# Patient Record
Sex: Male | Born: 1995 | Race: White | Hispanic: No | Marital: Single | Smoking: Never smoker
Health system: Southern US, Community
[De-identification: ages and names within clinical notes are randomized; demographics above are authoritative.]

---

## 2017-08-12 ENCOUNTER — Ambulatory Visit
Admission: EM | Admit: 2017-08-12 | Discharge: 2017-08-12 | Disposition: A | Discharge status: Home / Self Care | Payer: BLUE CROSS/BLUE SHIELD | Attending: Family Medicine | Admitting: Family Medicine

## 2017-08-12 ENCOUNTER — Other Ambulatory Visit: Payer: Self-pay

## 2017-08-12 DIAGNOSIS — Z23 Encounter for immunization: Secondary | ICD-10-CM | POA: Diagnosis not present

## 2017-08-12 DIAGNOSIS — S61411A Laceration without foreign body of right hand, initial encounter: Secondary | ICD-10-CM

## 2017-08-12 DIAGNOSIS — W268XXA Contact with other sharp object(s), not elsewhere classified, initial encounter: Secondary | ICD-10-CM

## 2017-08-12 MED ORDER — TETANUS-DIPHTH-ACELL PERTUSSIS 5-2.5-18.5 LF-MCG/0.5 IM SUSP
0.5000 mL | Freq: Once | INTRAMUSCULAR | Status: AC
  Administered 2017-08-12: 0.5 mL via INTRAMUSCULAR

## 2017-08-12 NOTE — ED Provider Notes (Signed)
[facility]    CSN: [id number] Arrival date & time: 08/12/17  1719     History   Chief Complaint Chief Complaint  Patient presents with  . Laceration    HPI Sergio Rivera is a 22 y.o. male.   HPI  22 year old male presents with laceration to his right dominant palm that he cut on a rusty fuel tank  about 10 AM yesterday at work.  He was urged by his coworkers to get a tetanus shot.  Denies any fever chills drainage numbness tingling or weakness.  He had applied over-the-counter glue to cover over the laceration.       History reviewed. No pertinent past medical history.  There are no active problems to display for this patient.   History reviewed. No pertinent surgical history.     Home Medications    Prior to Admission medications   Not on File    Family History Family History  Problem Relation Age of Onset  . Hypertension Mother   . Liver disease Father   . Alcoholism Father     Social History Social History   Tobacco Use  . Smoking status: Never Smoker  . Smokeless tobacco: Never Used  Substance Use Topics  . Alcohol use: No    Frequency: Never  . Drug use: No     Allergies   Patient has no known allergies.   Review of Systems Review of Systems  Constitutional: Negative for activity change, chills, fatigue and fever.  Skin: Positive for wound.  All other systems reviewed and are negative.    Physical Exam Triage Vital Signs ED Triage Vitals  Enc Vitals Group     BP 08/12/17 1737 (!) 114/58     Pulse Rate 08/12/17 1737 80     Resp 08/12/17 1737 16     Temp 08/12/17 1737 98 F (36.7 C)     Temp Source 08/12/17 1737 Oral     SpO2 08/12/17 1737 98 %     Weight 08/12/17 1734 143 lb (64.9 kg)     Height 08/12/17 1734 6\' 1"  (1.854 m)     Head Circumference --      Peak Flow --      Pain Score 08/12/17 1737 2     Pain Loc --      Pain Edu? --      Excl. in GC? --    No data found.  Updated Vital Signs BP (!)  114/58 (BP Location: Right Arm)   Pulse 80   Temp 98 F (36.7 C) (Oral)   Resp 16   Ht 6\' 1"  (1.854 m)   Wt 143 lb (64.9 kg)   SpO2 98%   BMI 18.87 kg/m   Visual Acuity Right Eye Distance:   Left Eye Distance:   Bilateral Distance:    Right Eye Near:   Left Eye Near:    Bilateral Near:     Physical Exam  Constitutional: He is oriented to person, place, and time. He appears well-developed and well-nourished. No distress.  HENT:  Head: Normocephalic.  Eyes: Pupils are equal, round, and reactive to light.  Neck: Normal range of motion.  Musculoskeletal: Normal range of motion.  Neurological: He is alert and oriented to person, place, and time.  Skin: Skin is warm and dry. He is not diaphoretic.  Examination of the right dominant palm shows a 1.5 cm longitudinally oriented laceration over the thenar eminence base ulnarly.  FDS/ FDP of all of his  fingers are intact.  He has full range of motion.  There is no erythema or drainage ecchymosis  around the laceration.  There is no induration.  Psychiatric: He has a normal mood and affect. His behavior is normal. Judgment and thought content normal.  Nursing note and vitals reviewed.    UC Treatments / Results  Labs (all labs ordered are listed, but only abnormal results are displayed) Labs Reviewed - No data to display  EKG  EKG Interpretation None       Radiology No results found.  Procedures Procedures (including critical care time)  Medications Ordered in UC Medications  Tdap (BOOSTRIX) injection 0.5 mL (0.5 mLs Intramuscular Given 08/12/17 1743)     Initial Impression / Assessment and Plan / UC Course  I have reviewed the triage vital signs and the nursing notes.  Pertinent labs & imaging results that were available during my care of the patient were reviewed by me and considered in my medical decision making (see chart for details).     Plan: 1. Test/x-ray results and diagnosis reviewed with patient 2. rx  as per orders; risks, benefits, potential side effects reviewed with patient 3. Recommend supportive treatment with ring for signs of infection.  These were outlined in detail to the patient.  Otherwise he should expect full recovery. 4. F/u prn if symptoms worsen or don't improve   Final Clinical Impressions(s) / UC Diagnoses   Final diagnoses:  Laceration of palm, right, initial encounter    ED Discharge Orders    None       Controlled Substance Prescriptions [place] Controlled Substance Registry consulted? Not Applicable   [name], PA-C 08/12/17 2004

## 2017-08-12 NOTE — ED Notes (Signed)
Wound cleansed with Normal saline and Hibiclens

## 2017-08-12 NOTE — Discharge Instructions (Signed)
Watch out for signs or symptoms of infection that we discussed ;increased drainage or pain swelling or redness.  If any these occur return to our clinic immediately.

## 2017-08-12 NOTE — ED Triage Notes (Signed)
Pt reports cutting the palm of his hand on a rusty oil tank yesterday morning at 10:00 a.m.

## 2017-08-12 NOTE — Medical Student Note (Signed)
[facility] Provider Student Note For educational purposes for Medical, PA and NP students only and not part of the legal medical record.   CSN: [id number] Arrival date & time: 08/12/17  1719     History   Chief Complaint Chief Complaint  Patient presents with  . Laceration    HPI Xzaviar Maloof is a 22 y.o. male.  HPI  22 year old white male presents complaining of laceration to his right palm from a rusty fuel tank yesterday at 10 am while at work. States he came today to get a tetanus shot after his coworkers advised him to. Denies fever/chills, drainage, numbness/tingling, weakness. He is right hand dominant.    History reviewed. No pertinent past medical history.  There are no active problems to display for this patient.   History reviewed. No pertinent surgical history.     Home Medications    Prior to Admission medications   Not on File    Family History Family History  Problem Relation Age of Onset  . Hypertension Mother   . Liver disease Father   . Alcoholism Father     Social History Social History   Tobacco Use  . Smoking status: Never Smoker  . Smokeless tobacco: Never Used  Substance Use Topics  . Alcohol use: No    Frequency: Never  . Drug use: No     Allergies   Patient has no known allergies.   Review of Systems Review of Systems  Constitutional: Negative for chills and fever.  HENT: Negative for ear pain and sore throat.   Eyes: Negative for pain and visual disturbance.  Respiratory: Negative for cough and shortness of breath.   Cardiovascular: Negative for chest pain and palpitations.  Gastrointestinal: Negative for abdominal pain and vomiting.  Genitourinary: Negative for dysuria and hematuria.  Musculoskeletal: Negative for arthralgias and back pain.  Skin: Positive for wound. Negative for color change and rash.  Neurological: Negative for seizures and syncope.  All other systems reviewed and are  negative.    Physical Exam Updated Vital Signs BP (!) 114/58 (BP Location: Right Arm)   Pulse 80   Temp 98 F (36.7 C) (Oral)   Resp 16   Ht 6\' 1"  (1.854 m)   Wt 64.9 kg   SpO2 98%   BMI 18.87 kg/m   Physical Exam  Constitutional: He is oriented to person, place, and time. He appears well-developed and well-nourished.  HENT:  Head: Normocephalic and atraumatic.  Eyes: Conjunctivae are normal.  Neck: Neck supple.  Cardiovascular: Normal rate and regular rhythm.  No murmur heard. Pulmonary/Chest: Effort normal and breath sounds normal. No respiratory distress.  Abdominal: Soft. There is no tenderness.  Musculoskeletal: Normal range of motion. He exhibits no edema.  Full ROM intact and 5/5 RUE strength.   Neurological: He is alert and oriented to person, place, and time.  Bilateral UE are neurovascularly intact. No diminished sensation.   Skin: Skin is warm and dry. Capillary refill takes less than 2 seconds.  2 cm laceration to palmar aspect of right hand without drainage, erythema, tenderness, induration. It is covered with wound glue that patient applied yesterday. Bleeding is well controlled.   Psychiatric: He has a normal mood and affect.  Nursing note and vitals reviewed.    ED Treatments / Results  Labs (all labs ordered are listed, but only abnormal results are displayed) Labs Reviewed - No data to display  EKG  EKG Interpretation None  Radiology No results found.  Procedures Procedures (including critical care time)  Medications Ordered in ED Medications  Tdap (BOOSTRIX) injection 0.5 mL (0.5 mLs Intramuscular Given 08/12/17 1743)     Initial Impression / Assessment and Plan / ED Course  I have reviewed the triage vital signs and the nursing notes.  Pertinent labs & imaging results that were available during my care of the patient were reviewed by me and considered in my medical decision making (see chart for details).     22 year old  white male presents for right palm laceration that happened 10 am yesterday. No current signs of infection and wound is well healing with bleeding well controlled. There is no indication for laceration repair. Patient counseled to monitor for signs of infection such as increased pain, erythema, drainage, fever/chills. He received tetanus shot in clinic today. Patient was agreeable to plan.   Final Clinical Impressions(s) / ED Diagnoses   Final diagnoses:  None    New Prescriptions New Prescriptions   No medications on file

## 2017-08-15 ENCOUNTER — Telehealth: Payer: Self-pay | Admitting: *Deleted

## 2017-08-15 NOTE — Telephone Encounter (Signed)
Called patient, patient reported laceration healing well. Advised patient to follow up with PCP or [facility] if he observes swelling, redness, of drainage.

## 2017-11-26 ENCOUNTER — Ambulatory Visit (INDEPENDENT_AMBULATORY_CARE_PROVIDER_SITE_OTHER): Payer: BLUE CROSS/BLUE SHIELD

## 2017-11-26 ENCOUNTER — Ambulatory Visit
Admission: EM | Admit: 2017-11-26 | Discharge: 2017-11-26 | Disposition: A | Discharge status: Home / Self Care | Payer: BLUE CROSS/BLUE SHIELD | Attending: Family Medicine | Admitting: Family Medicine

## 2017-11-26 ENCOUNTER — Ambulatory Visit: Payer: BLUE CROSS/BLUE SHIELD

## 2017-11-26 DIAGNOSIS — R0789 Other chest pain: Secondary | ICD-10-CM

## 2017-11-26 DIAGNOSIS — M940 Chondrocostal junction syndrome [Tietze]: Secondary | ICD-10-CM
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
## 2021-02-01 ENCOUNTER — Other Ambulatory Visit: Payer: Self-pay

## 2021-02-01 DIAGNOSIS — Y990 Civilian activity done for income or pay: Secondary | ICD-10-CM | POA: Insufficient documentation

## 2021-02-01 DIAGNOSIS — M545 Low back pain, unspecified: Secondary | ICD-10-CM | POA: Insufficient documentation

## 2021-02-01 DIAGNOSIS — X500XXA Overexertion from strenuous movement or load, initial encounter: Secondary | ICD-10-CM | POA: Insufficient documentation

## 2021-02-01 DIAGNOSIS — G8929 Other chronic pain: Secondary | ICD-10-CM | POA: Insufficient documentation

## 2021-02-01 NOTE — ED Triage Notes (Signed)
Lower back pain for months. States taking too much excedrin.

## 2021-02-02 ENCOUNTER — Encounter (HOSPITAL_BASED_OUTPATIENT_CLINIC_OR_DEPARTMENT_OTHER): Payer: Self-pay | Admitting: Emergency Medicine

## 2021-02-02 ENCOUNTER — Emergency Department (HOSPITAL_BASED_OUTPATIENT_CLINIC_OR_DEPARTMENT_OTHER)
Admission: EM | Admit: 2021-02-02 | Discharge: 2021-02-02 | Disposition: A | Discharge status: Home / Self Care | Payer: BLUE CROSS/BLUE SHIELD | Attending: Emergency Medicine | Admitting: Emergency Medicine

## 2021-02-02 DIAGNOSIS — G8929 Other chronic pain: Secondary | ICD-10-CM

## 2021-02-02 MED ORDER — MELOXICAM 15 MG PO TABS
ORAL_TABLET | ORAL | 0 refills | Status: AC
  Filled 2021-02-06: qty 30, 30d supply, fill #0

## 2021-02-02 MED ORDER — NAPROXEN 250 MG PO TABS
500.0000 mg | ORAL_TABLET | Freq: Once | ORAL | Status: AC
  Administered 2021-02-02: 500 mg via ORAL
  Filled 2021-02-02: qty 2

## 2021-02-02 NOTE — ED Provider Notes (Signed)
[facility] Provider Note: Lowella Dell, MD, FACEP  CSN: [id number] MRN: [id number] ARRIVAL: 02/01/21 at 2347 ROOM: [facility]04/[facility]   CHIEF COMPLAINT  Back Pain   HISTORY OF PRESENT ILLNESS  02/02/21 3:43 AM Sergio Rivera is a 25 y.o. male who has had low back pain for several months.  The back pain is located in the bilateral paralumbar region.  He started a new job about 2 weeks ago that involves a lot of heavy lifting.  At work yesterday evening he stepped down and felt a sudden exacerbation of the pain in his back.  He rated it an 8 out of 10 on arrival here.  He had taken about 5 Excedrin tablets as well as ibuprofen and he is now feeling significantly better.  The pain does not radiate to his legs.  It is worse with movement of the lower back but not palpation.  He has no associated numbness or weakness.   History reviewed. No pertinent past medical history.  History reviewed. No pertinent surgical history.  Family History  Problem Relation Age of Onset   Hypertension Mother    Liver disease Father    Alcoholism Father     Social History   Tobacco Use   Smoking status: Never   Smokeless tobacco: Never  Vaping Use   Vaping Use: Every day  Substance Use Topics   Alcohol use: No   Drug use: No    Prior to Admission medications   Not on File    Allergies Patient has no known allergies.   REVIEW OF SYSTEMS  Negative except as noted here or in the History of Present Illness.   PHYSICAL EXAMINATION  Initial Vital Signs Blood pressure 115/63, pulse 81, temperature 98.7 F (37.1 C), temperature source Oral, resp. rate 16, height 6\' 2"  (1.88 m), weight 68 kg, SpO2 100 %.  Examination General: Well-developed, well-nourished male in no acute distress; appearance consistent with age of record HENT: normocephalic; atraumatic Eyes: Normal appearance Neck: supple Heart: regular rate and rhythm Lungs: clear to auscultation bilaterally Back: Nontender; pain  on movement of lower back Abdomen: soft; nondistended; nontender; bowel sounds present Extremities: No deformity; full range of motion; pulses normal Neurologic: Awake, alert and oriented; motor function intact in all extremities and symmetric; no facial droop Skin: Warm and dry Psychiatric: Normal mood and affect   RESULTS  Summary of this visit's results, reviewed and interpreted by myself:   EKG Interpretation  Date/Time:    Ventricular Rate:    PR Interval:    QRS Duration:   QT Interval:    QTC Calculation:   R Axis:     Text Interpretation:          Laboratory Studies: No results found for this or any previous visit (from the past 24 hour(s)). Imaging Studies: No results found.  ED COURSE and MDM  Nursing notes, initial and subsequent vitals signs, including pulse oximetry, reviewed and interpreted by myself.  Vitals:   02/02/21 0000 02/02/21 0002  BP:  115/63  Pulse:  81  Resp:  16  Temp:  98.7 F (37.1 C)  TempSrc:  Oral  SpO2:  100%  Weight: 68 kg   Height: 6\' 2"  (1.88 m)    Medications  naproxen (NAPROSYN) tablet 500 mg (has no administration in time range)    The patient's back pain has been fairly chronic but is clearly being exacerbated by a new job involving a lot of lifting.  I do not  believe plain x-rays would be of benefit at this time although the patient may benefit from an MRI at a later time.  He was advised that Excedrin and other NSAIDs can be hard on the stomach and we will try a course of Mobic which is less irritating.  We will refer to sports medicine for evaluation and further treatment.  PROCEDURES  Procedures   ED DIAGNOSES     ICD-10-CM   1. Exacerbation of chronic back pain  M54.9    G89.29          Adewale Pucillo, Jonny Ruiz, MD 02/02/21 [phone]

## 2021-02-06 ENCOUNTER — Other Ambulatory Visit (HOSPITAL_BASED_OUTPATIENT_CLINIC_OR_DEPARTMENT_OTHER): Payer: Self-pay

## 2021-02-06 ENCOUNTER — Other Ambulatory Visit: Payer: Self-pay

## 2021-02-06 ENCOUNTER — Ambulatory Visit (INDEPENDENT_AMBULATORY_CARE_PROVIDER_SITE_OTHER): Payer: Self-pay | Admitting: Family Medicine

## 2021-02-06 ENCOUNTER — Encounter: Payer: Self-pay | Admitting: Family Medicine

## 2021-02-06 ENCOUNTER — Ambulatory Visit (HOSPITAL_BASED_OUTPATIENT_CLINIC_OR_DEPARTMENT_OTHER)
Admission: RE | Admit: 2021-02-06 | Discharge: 2021-02-06 | Disposition: A | Discharge status: Home / Self Care | Payer: Self-pay | Source: Ambulatory Visit | Attending: Family Medicine | Admitting: Family Medicine

## 2021-02-06 VITALS — BP 110/62 | Ht 74.0 in | Wt 150.0 lb

## 2021-02-06 DIAGNOSIS — S39012A Strain of muscle, fascia and tendon of lower back, initial encounter: Secondary | ICD-10-CM | POA: Insufficient documentation

## 2021-02-06 MED ORDER — KETOROLAC TROMETHAMINE 30 MG/ML IJ SOLN
30.0000 mg | Freq: Once | INTRAMUSCULAR | Status: AC
  Administered 2021-02-06: 30 mg via INTRAMUSCULAR

## 2021-02-06 MED ORDER — METHYLPREDNISOLONE ACETATE 40 MG/ML IJ SUSP
40.0000 mg | Freq: Once | INTRAMUSCULAR | Status: AC
  Administered 2021-02-06: 40 mg via INTRAMUSCULAR

## 2021-02-06 NOTE — Assessment & Plan Note (Signed)
Significant spasm appreciated on exam today.  Has been ongoing for period of time.  Less likely for pars defect. -Counseled on home exercise therapy and supportive care. -IM Depo-Medrol and IM Toradol. -X-ray. -Referral to physical therapy. -Provided work note.

## 2021-02-06 NOTE — Progress Notes (Signed)
  Sergio Rivera - 25 y.o. male MRN [id number]  Date of birth: 01/10/1996  SUBJECTIVE:  Including CC & ROS.  No chief complaint on file.   Sergio Rivera is a 25 y.o. male that is presenting with acute back pain.  Pain was exacerbated by the that he does.  He does a lot of heavy lifting repetitively.  Pain has been ongoing for about a year.  Denies any injuries.   Review of Systems See HPI   HISTORY: Past Medical, Surgical, Social, and Family History Reviewed & Updated per EMR.   Pertinent Historical Findings include:  History reviewed. No pertinent past medical history.  History reviewed. No pertinent surgical history.  Family History  Problem Relation Age of Onset   Hypertension Mother    Liver disease Father    Alcoholism Father     Social History   Socioeconomic History   Marital status: Single    Spouse name: Not on file   Number of children: Not on file   Years of education: Not on file   Highest education level: Not on file  Occupational History   Not on file  Tobacco Use   Smoking status: Never   Smokeless tobacco: Never  Vaping Use   Vaping Use: Every day  Substance and Sexual Activity   Alcohol use: No   Drug use: No   Sexual activity: Not on file  Other Topics Concern   Not on file  Social History Narrative   Not on file   Social Determinants of Health   Financial Resource Strain: Not on file  Food Insecurity: Not on file  Transportation Needs: Not on file  Physical Activity: Not on file  Stress: Not on file  Social Connections: Not on file  Intimate Partner Violence: Not on file     PHYSICAL EXAM:  VS: BP 110/62 (BP Location: Left Arm, Patient Position: Sitting, Cuff Size: Normal)   Ht 6\' 2"  (1.88 m)   Wt 150 lb (68 kg)   BMI 19.26 kg/m  Physical Exam Gen: NAD, alert, cooperative with exam, well-appearing MSK:  Limited flexion and extension. Unable stand on 1 foot. Neurovascular intact     ASSESSMENT & PLAN:   Strain of lumbar  region Significant spasm appreciated on exam today.  Has been ongoing for period of time.  Less likely for pars defect. -Counseled on home exercise therapy and supportive care. -IM Depo-Medrol and IM Toradol. -X-ray. -Referral to physical therapy. -Provided work note.

## 2021-02-06 NOTE — Patient Instructions (Signed)
Nice to meet you Please try heat  Please try the exercises  Please try physical therapy  I will call with the results from today   Please send me a message in MyChart with any questions or updates.  Please see me back in 2 weeks.   --Dr. Jordan Likes

## 2021-02-09 ENCOUNTER — Telehealth: Payer: Self-pay | Admitting: Family Medicine

## 2021-02-09 NOTE — Telephone Encounter (Signed)
Informed of results. Will provide.   Myra Rude, MD [facility] 02/09/2021, 1:44 PM

## 2021-02-13 ENCOUNTER — Ambulatory Visit: Payer: Self-pay | Attending: Family Medicine | Admitting: Physical Therapy

## 2021-02-20 ENCOUNTER — Ambulatory Visit: Payer: Self-pay | Admitting: Physical Therapy

## 2021-02-20 ENCOUNTER — Ambulatory Visit: Payer: Self-pay | Admitting: Family Medicine

## 2021-02-20 NOTE — Progress Notes (Deleted)
  Sergio Rivera - 25 y.o. male MRN [id number]  Date of birth: 02/26/1996  SUBJECTIVE:  Including CC & ROS.  No chief complaint on file.   Sergio Rivera is a 25 y.o. male that is  ***.  ***   Review of Systems See HPI   HISTORY: Past Medical, Surgical, Social, and Family History Reviewed & Updated per EMR.   Pertinent Historical Findings include:  No past medical history on file.  No past surgical history on file.  Family History  Problem Relation Age of Onset   Hypertension Mother    Liver disease Father    Alcoholism Father     Social History   Socioeconomic History   Marital status: Single    Spouse name: Not on file   Number of children: Not on file   Years of education: Not on file   Highest education level: Not on file  Occupational History   Not on file  Tobacco Use   Smoking status: Never   Smokeless tobacco: Never  Vaping Use   Vaping Use: Every day  Substance and Sexual Activity   Alcohol use: No   Drug use: No   Sexual activity: Not on file  Other Topics Concern   Not on file  Social History Narrative   Not on file   Social Determinants of Health   Financial Resource Strain: Not on file  Food Insecurity: Not on file  Transportation Needs: Not on file  Physical Activity: Not on file  Stress: Not on file  Social Connections: Not on file  Intimate Partner Violence: Not on file     PHYSICAL EXAM:  VS: There were no vitals taken for this visit. Physical Exam Gen: NAD, alert, cooperative with exam, well-appearing MSK:  ***      ASSESSMENT & PLAN:   No problem-specific Assessment & Plan notes found for this encounter.

## 2021-03-05 ENCOUNTER — Telehealth: Payer: Self-pay | Admitting: Family Medicine

## 2021-03-05 NOTE — Telephone Encounter (Signed)
Patient called office states wishes to return to work without any restrictions but Employer will not allow until a RTW note/letter received from provider.  --Forwarding request to med asst for review w/Dr.Schmitz & drafting note.  --Please call pt if there are any question or concerns @ [phone].  --glh

## 2021-03-06 NOTE — Telephone Encounter (Signed)
Provided work note.   Myra Rude, MD [facility] 03/06/2021, 8:04 AM

## 2022-04-20 IMAGING — DX DG LUMBAR SPINE 2-3V
3 series · 3 of 3 positions shown · non-contrast
Comparison: None.

CLINICAL DATA: Low back pain after twisting injury last week.

EXAM:
LUMBAR SPINE - 2-3 VIEW

[l-spine ap]
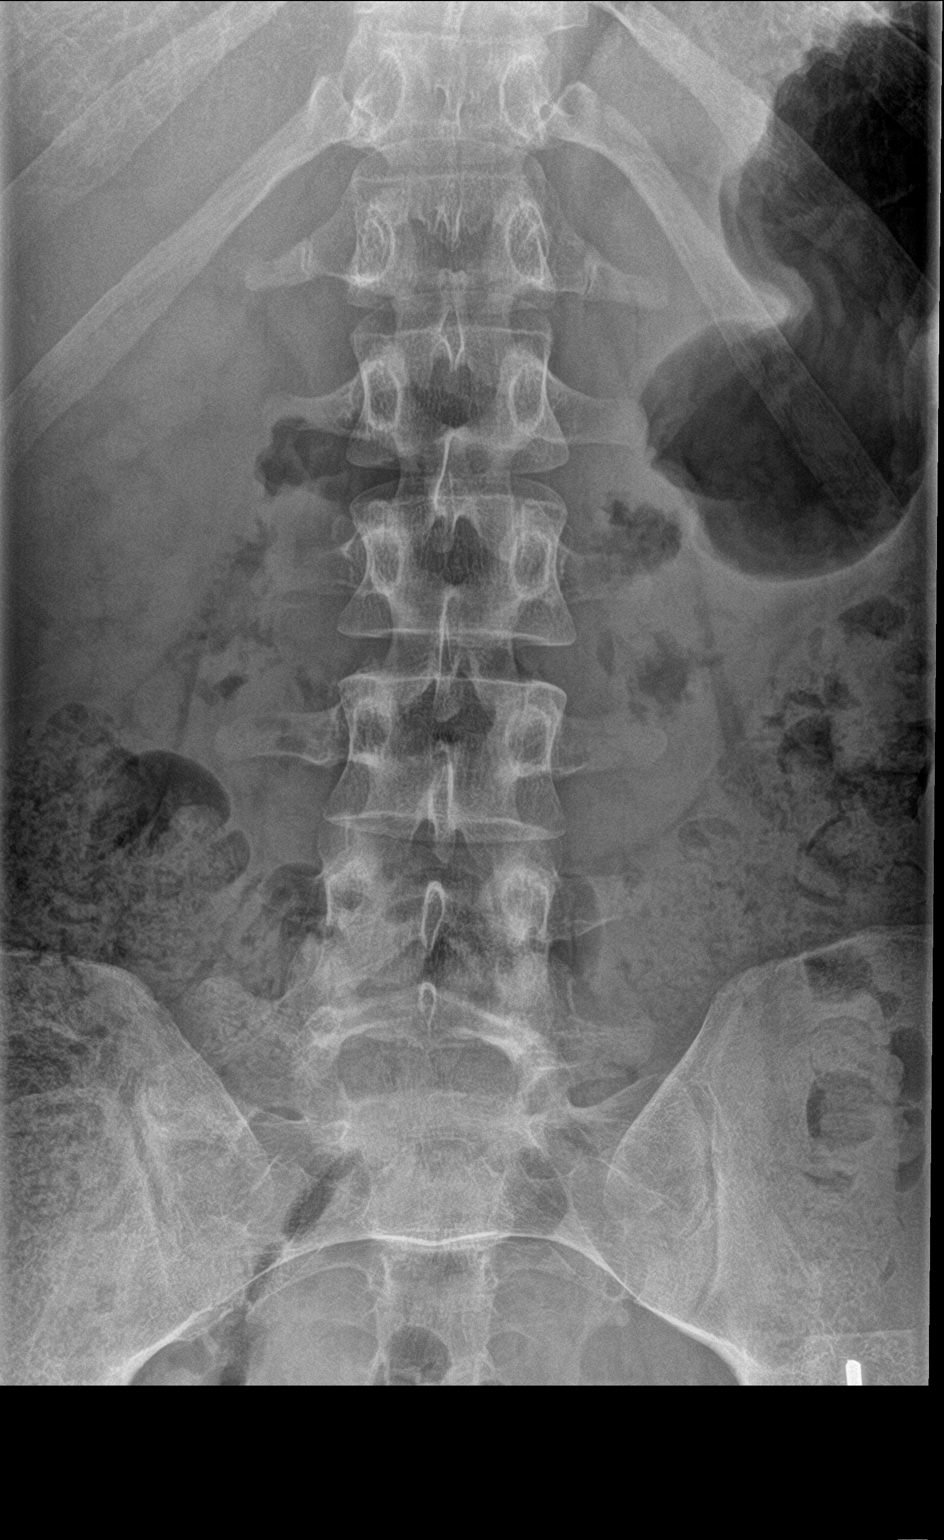

[l-spine lat]
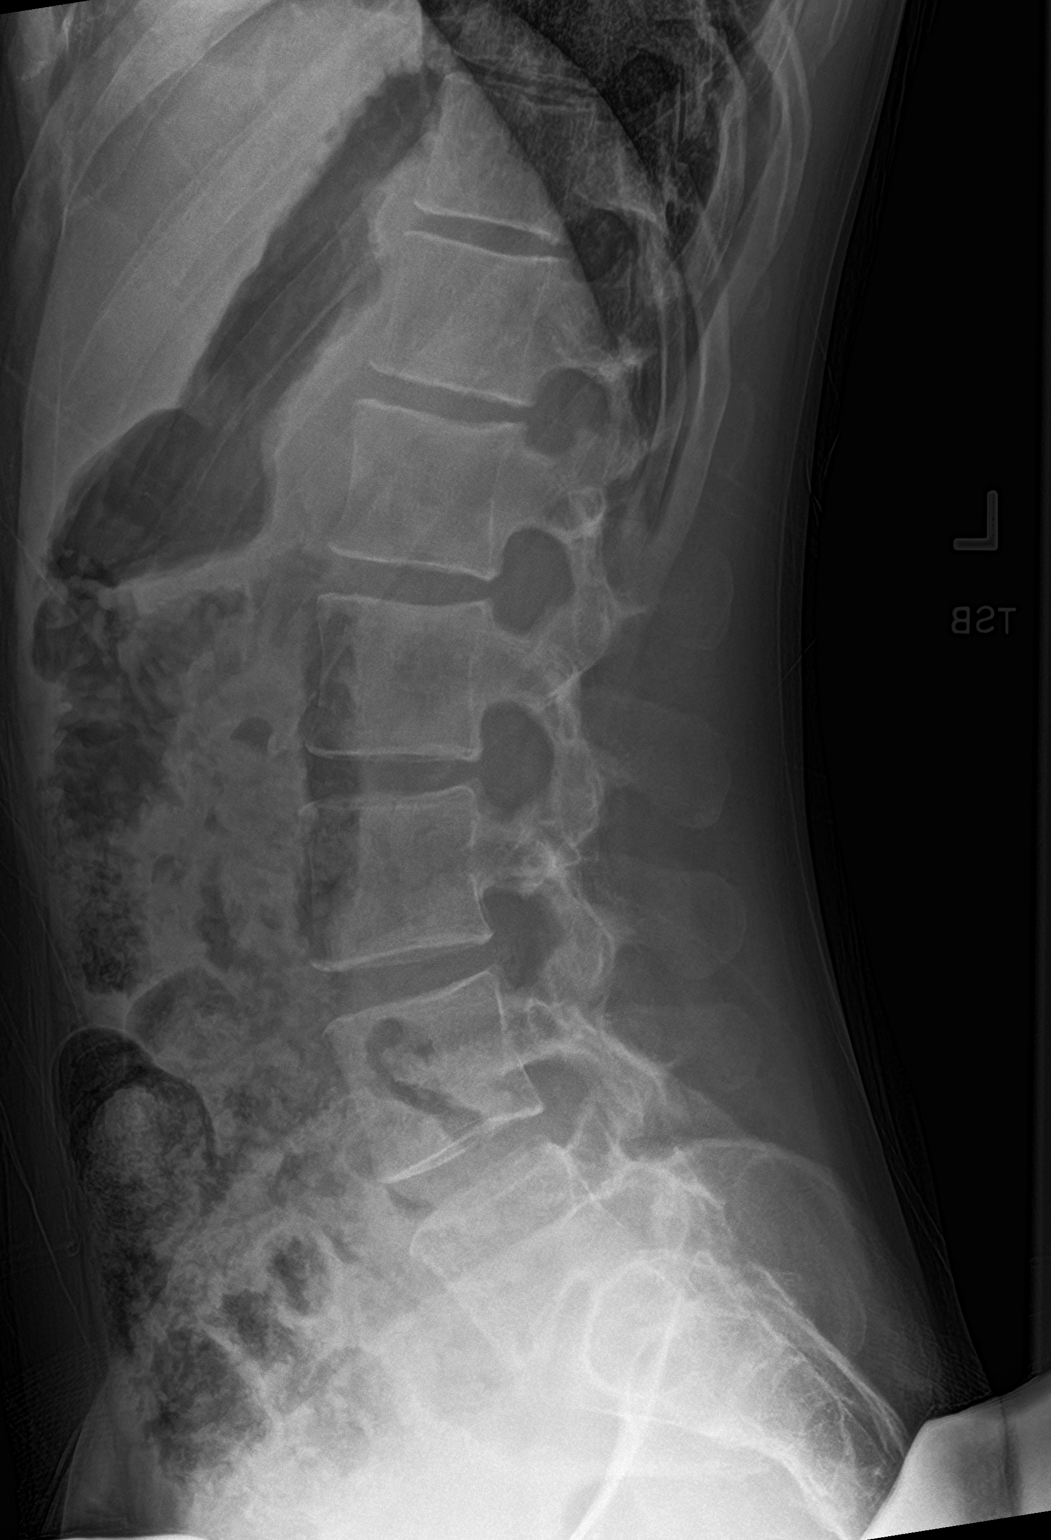

[l-spine spot]
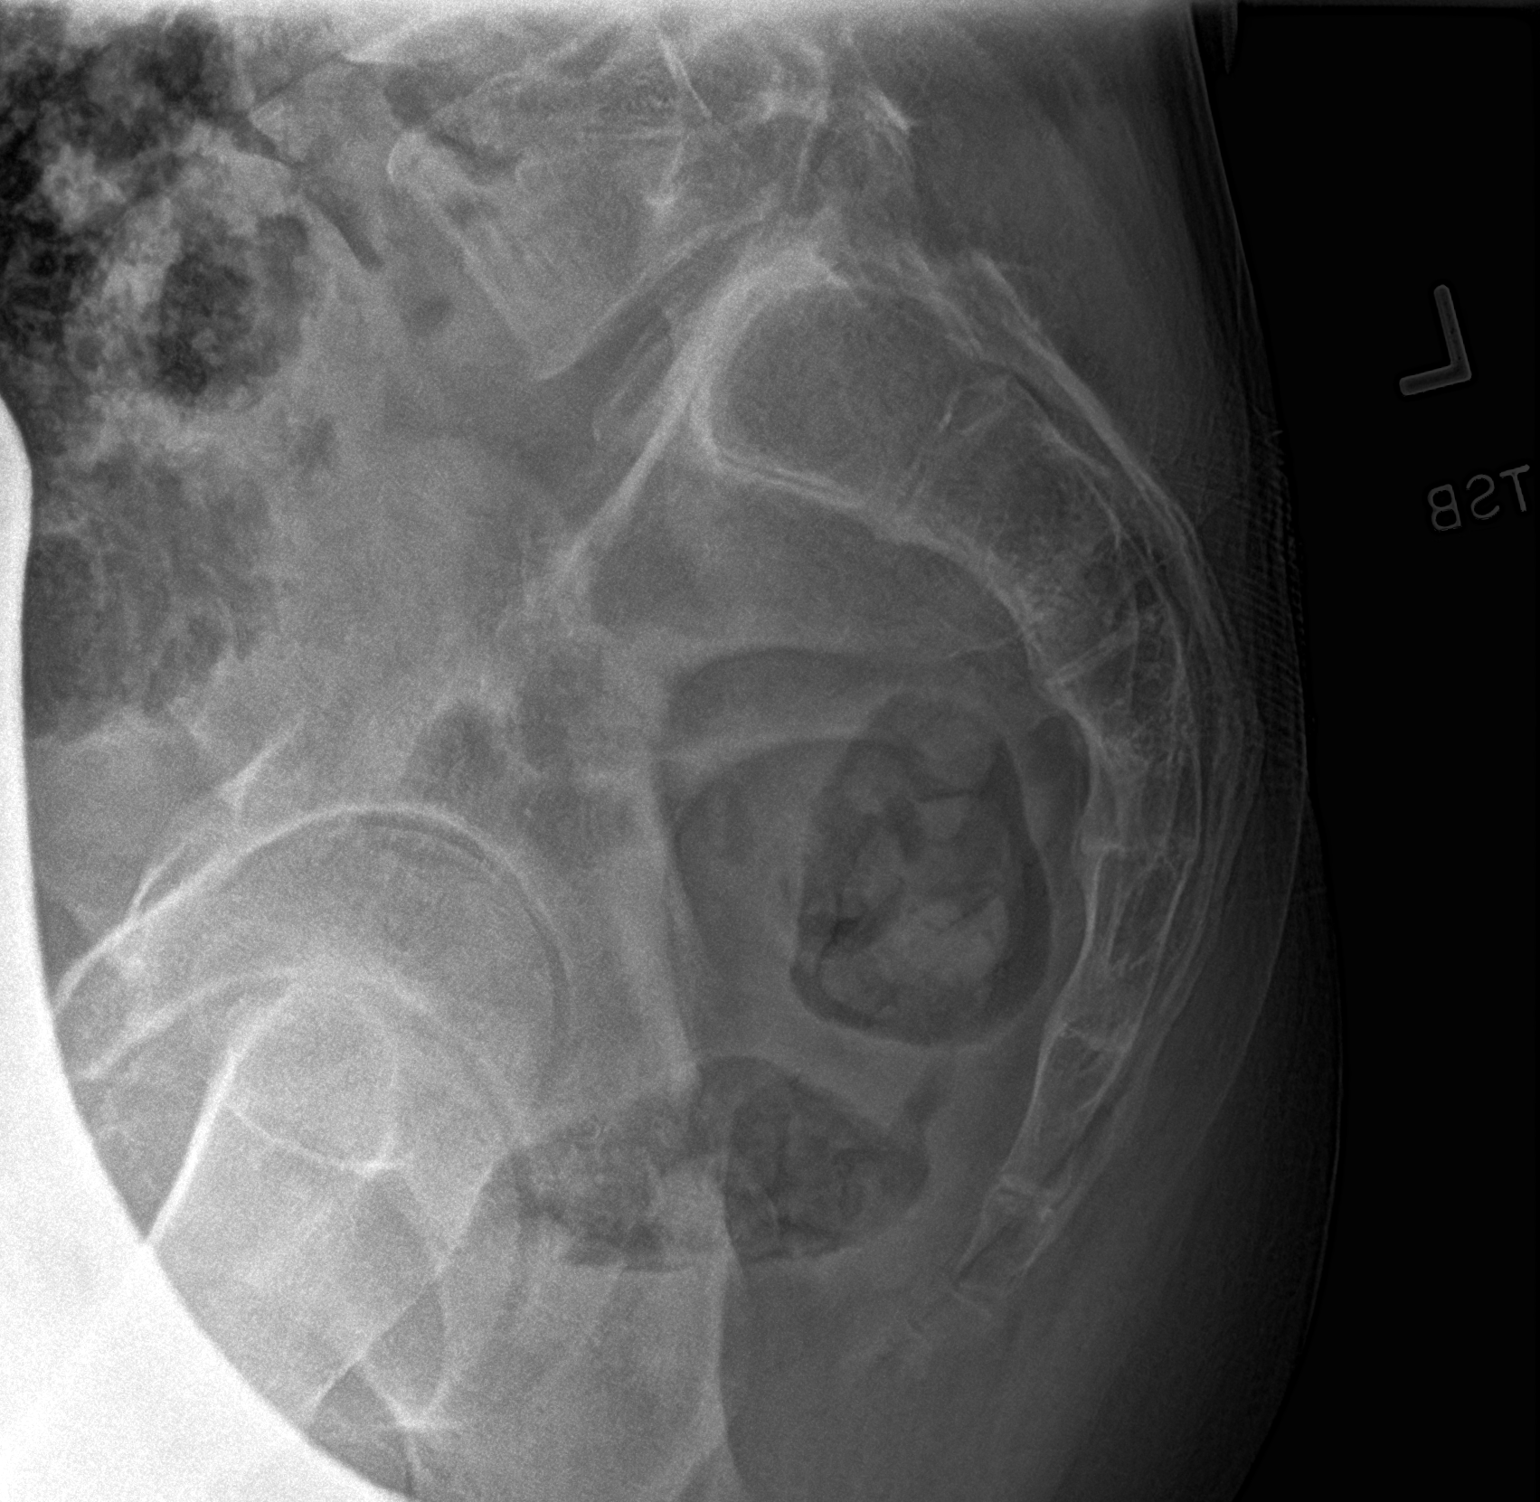

[3 of 3 positions shown; findings below may reference images not displayed]

FINDINGS: Diminutive twelfth ribs. Sacroiliac joints are symmetric.
Maintenance of vertebral body height and alignment. Loss of
intervertebral disc height at the lumbosacral junction is mild.
IMPRESSION: No acute osseous abnormality.

## 2022-11-11 ENCOUNTER — Encounter: Payer: Self-pay | Admitting: *Deleted
